# Patient Record
Sex: Female | Born: 1965 | Race: Black or African American | Hispanic: No | Marital: Single | Smoking: Never smoker
Health system: Southern US, Community
[De-identification: ages and names within clinical notes are randomized; demographics above are authoritative.]

## PROBLEM LIST (undated history)

## (undated) HISTORY — PX: CHOLECYSTECTOMY: SHX55

## (undated) HISTORY — PX: ABDOMINAL HYSTERECTOMY: SHX81

---

## 2004-04-28 ENCOUNTER — Emergency Department: Payer: Self-pay | Admitting: Emergency Medicine

## 2004-07-29 ENCOUNTER — Emergency Department: Payer: Self-pay | Admitting: Emergency Medicine

## 2004-08-28 ENCOUNTER — Emergency Department: Payer: Self-pay | Admitting: Unknown Physician Specialty

## 2004-11-27 ENCOUNTER — Ambulatory Visit: Payer: Self-pay | Admitting: Obstetrics and Gynecology

## 2005-05-06 ENCOUNTER — Emergency Department: Payer: Self-pay | Admitting: Emergency Medicine

## 2005-09-28 ENCOUNTER — Emergency Department: Payer: Self-pay | Admitting: Emergency Medicine

## 2005-10-01 ENCOUNTER — Emergency Department: Payer: Self-pay | Admitting: Emergency Medicine

## 2005-10-08 ENCOUNTER — Emergency Department: Payer: Self-pay | Admitting: Emergency Medicine

## 2006-02-17 ENCOUNTER — Emergency Department (HOSPITAL_COMMUNITY): Admission: EM | Admit: 2006-02-17 | Discharge: 2006-02-17 | Payer: Self-pay | Admitting: Emergency Medicine

## 2006-07-01 ENCOUNTER — Emergency Department (HOSPITAL_COMMUNITY): Admission: EM | Admit: 2006-07-01 | Discharge: 2006-07-01 | Payer: Self-pay | Admitting: Emergency Medicine

## 2006-08-08 ENCOUNTER — Emergency Department (HOSPITAL_COMMUNITY): Admission: EM | Admit: 2006-08-08 | Discharge: 2006-08-08 | Payer: Self-pay | Admitting: Emergency Medicine

## 2007-05-26 ENCOUNTER — Emergency Department: Payer: Self-pay | Admitting: Emergency Medicine

## 2008-04-12 ENCOUNTER — Emergency Department: Payer: Self-pay | Admitting: Emergency Medicine

## 2008-09-30 ENCOUNTER — Emergency Department: Payer: Self-pay | Admitting: Emergency Medicine

## 2009-05-06 ENCOUNTER — Emergency Department: Payer: Self-pay | Admitting: Emergency Medicine

## 2010-08-22 ENCOUNTER — Emergency Department: Payer: Self-pay | Admitting: Emergency Medicine

## 2010-12-17 LAB — URINALYSIS, ROUTINE W REFLEX MICROSCOPIC
Bilirubin Urine: NEGATIVE
Glucose, UA: NEGATIVE
Protein, ur: NEGATIVE
Urobilinogen, UA: 1

## 2010-12-17 LAB — URINE CULTURE: Colony Count: >=100000

## 2010-12-17 LAB — BASIC METABOLIC PANEL
CO2: 27
Calcium: 9.8
Chloride: 110
GFR calc Af Amer: >60
Sodium: 143

## 2010-12-17 LAB — URINE MICROSCOPIC-ADD ON

## 2010-12-31 ENCOUNTER — Emergency Department (HOSPITAL_COMMUNITY)
Admission: EM | Admit: 2010-12-31 | Discharge: 2010-12-31 | Disposition: A | Discharge status: Home / Self Care | Payer: Self-pay | Attending: Emergency Medicine | Admitting: Emergency Medicine

## 2010-12-31 DIAGNOSIS — J329 Chronic sinusitis, unspecified: Secondary | ICD-10-CM | POA: Insufficient documentation

## 2010-12-31 DIAGNOSIS — J069 Acute upper respiratory infection, unspecified: Secondary | ICD-10-CM | POA: Insufficient documentation

## 2010-12-31 DIAGNOSIS — R5381 Other malaise: Secondary | ICD-10-CM | POA: Insufficient documentation

## 2010-12-31 DIAGNOSIS — R071 Chest pain on breathing: Secondary | ICD-10-CM | POA: Insufficient documentation

## 2011-08-13 ENCOUNTER — Emergency Department: Payer: Self-pay | Admitting: Unknown Physician Specialty

## 2012-03-30 ENCOUNTER — Emergency Department: Payer: Self-pay | Admitting: Internal Medicine

## 2012-03-30 LAB — COMPREHENSIVE METABOLIC PANEL
Albumin: 3.7 g/dL (ref 3.4–5.0)
Chloride: 108 mmol/L — ABNORMAL HIGH (ref 98–107)
Co2: 25 mmol/L (ref 21–32)
Glucose: 107 mg/dL — ABNORMAL HIGH (ref 65–99)
Osmolality: 275 (ref 275–301)
Total Protein: 7 g/dL (ref 6.4–8.2)

## 2012-03-30 LAB — CBC
HCT: 35.8 % (ref 35.0–47.0)
MCHC: 32.1 g/dL (ref 32.0–36.0)
MCV: 75 fL — ABNORMAL LOW (ref 80–100)
Platelet: 164 10*3/uL (ref 150–440)
RDW: 14.5 % (ref 11.5–14.5)

## 2012-03-30 LAB — HCG, QUANTITATIVE, PREGNANCY: Beta Hcg, Quant.: <1 m[IU]/mL — ABNORMAL LOW

## 2012-03-30 LAB — URINALYSIS, COMPLETE
Glucose,UR: NEGATIVE mg/dL (ref 0–75)
Ph: 5 (ref 4.5–8.0)
RBC,UR: 15 /HPF (ref 0–5)
Specific Gravity: 1.023 (ref 1.003–1.030)
WBC UR: 38 /HPF (ref 0–5)

## 2012-04-18 ENCOUNTER — Ambulatory Visit: Payer: Self-pay | Admitting: Obstetrics and Gynecology

## 2012-04-18 LAB — URINALYSIS, COMPLETE
Ketone: NEGATIVE
Ph: 5 (ref 4.5–8.0)
Specific Gravity: 1.026 (ref 1.003–1.030)
WBC UR: 18 /HPF (ref 0–5)

## 2012-04-18 LAB — CBC
HCT: 35.8 % (ref 35.0–47.0)
MCV: 75 fL — ABNORMAL LOW (ref 80–100)
Platelet: 233 10*3/uL (ref 150–440)
RBC: 4.8 10*6/uL (ref 3.80–5.20)

## 2012-04-28 ENCOUNTER — Inpatient Hospital Stay: Payer: Self-pay | Admitting: Obstetrics and Gynecology

## 2012-04-29 LAB — HEMATOCRIT: HCT: 26.3 % — ABNORMAL LOW (ref 35.0–47.0)

## 2012-04-30 ENCOUNTER — Emergency Department: Payer: Self-pay | Admitting: Emergency Medicine

## 2012-05-01 LAB — PATHOLOGY REPORT

## 2012-05-09 ENCOUNTER — Emergency Department: Payer: Self-pay | Admitting: Emergency Medicine

## 2012-05-09 LAB — URINALYSIS, COMPLETE
Glucose,UR: NEGATIVE mg/dL (ref 0–75)
Nitrite: NEGATIVE
Ph: 5 (ref 4.5–8.0)
Protein: 100
RBC,UR: 38 /HPF (ref 0–5)
Specific Gravity: 1.023 (ref 1.003–1.030)

## 2012-05-09 LAB — COMPREHENSIVE METABOLIC PANEL
Albumin: 3.4 g/dL (ref 3.4–5.0)
Alkaline Phosphatase: 106 U/L (ref 50–136)
Creatinine: 0.57 mg/dL — ABNORMAL LOW (ref 0.60–1.30)
EGFR (Non-African Amer.): >60
Osmolality: 277 (ref 275–301)
Potassium: 3.9 mmol/L (ref 3.5–5.1)
SGOT(AST): 14 U/L — ABNORMAL LOW (ref 15–37)
SGPT (ALT): 35 U/L (ref 12–78)

## 2012-05-09 LAB — CBC
HGB: 8.8 g/dL — ABNORMAL LOW (ref 12.0–16.0)
MCHC: 31.7 g/dL — ABNORMAL LOW (ref 32.0–36.0)
RDW: 14.9 % — ABNORMAL HIGH (ref 11.5–14.5)
WBC: 9.8 10*3/uL (ref 3.6–11.0)

## 2012-05-17 ENCOUNTER — Ambulatory Visit: Payer: Self-pay | Admitting: Obstetrics and Gynecology

## 2013-02-17 ENCOUNTER — Emergency Department: Payer: Self-pay | Admitting: Emergency Medicine

## 2013-05-10 ENCOUNTER — Emergency Department: Payer: Self-pay | Admitting: Emergency Medicine

## 2013-05-13 ENCOUNTER — Emergency Department: Payer: Self-pay | Admitting: Emergency Medicine

## 2013-05-13 LAB — CBC WITH DIFFERENTIAL/PLATELET
BASOS ABS: 0 10*3/uL (ref 0.0–0.1)
BASOS PCT: 0.6 %
EOS ABS: 0.3 10*3/uL (ref 0.0–0.7)
EOS PCT: 3.7 %
HCT: 36.9 % (ref 35.0–47.0)
HGB: 11.5 g/dL — AB (ref 12.0–16.0)
LYMPHS ABS: 1.6 10*3/uL (ref 1.0–3.6)
LYMPHS PCT: 23.1 %
MCH: 23.6 pg — ABNORMAL LOW (ref 26.0–34.0)
MCHC: 31.2 g/dL — ABNORMAL LOW (ref 32.0–36.0)
MCV: 76 fL — ABNORMAL LOW (ref 80–100)
MONO ABS: 0.4 x10 3/mm (ref 0.2–0.9)
Monocyte %: 5.6 %
NEUTROS ABS: 4.7 10*3/uL (ref 1.4–6.5)
Neutrophil %: 67 %
Platelet: 191 10*3/uL (ref 150–440)
RBC: 4.87 10*6/uL (ref 3.80–5.20)
RDW: 14.9 % — AB (ref 11.5–14.5)
WBC: 7 10*3/uL (ref 3.6–11.0)

## 2013-05-13 LAB — COMPREHENSIVE METABOLIC PANEL
ALBUMIN: 3.7 g/dL (ref 3.4–5.0)
ALK PHOS: 79 U/L
AST: 26 U/L (ref 15–37)
Anion Gap: 5 — ABNORMAL LOW (ref 7–16)
BILIRUBIN TOTAL: 0.3 mg/dL (ref 0.2–1.0)
BUN: 11 mg/dL (ref 7–18)
CALCIUM: 9.1 mg/dL (ref 8.5–10.1)
CHLORIDE: 107 mmol/L (ref 98–107)
CREATININE: 0.87 mg/dL (ref 0.60–1.30)
Co2: 28 mmol/L (ref 21–32)
EGFR (African American): >60
GLUCOSE: 102 mg/dL — AB (ref 65–99)
OSMOLALITY: 279 (ref 275–301)
POTASSIUM: 4.1 mmol/L (ref 3.5–5.1)
SGPT (ALT): 29 U/L (ref 12–78)
Sodium: 140 mmol/L (ref 136–145)
TOTAL PROTEIN: 6.9 g/dL (ref 6.4–8.2)

## 2013-09-23 ENCOUNTER — Emergency Department: Payer: Self-pay | Admitting: Emergency Medicine

## 2014-01-16 ENCOUNTER — Emergency Department: Payer: Self-pay | Admitting: Student

## 2014-01-16 LAB — CBC
HCT: 38.2 % (ref 35.0–47.0)
HGB: 11.9 g/dL — ABNORMAL LOW (ref 12.0–16.0)
MCH: 23.9 pg — AB (ref 26.0–34.0)
MCHC: 31.2 g/dL — AB (ref 32.0–36.0)
MCV: 76 fL — AB (ref 80–100)
PLATELETS: 216 10*3/uL (ref 150–440)
RBC: 5 10*6/uL (ref 3.80–5.20)
RDW: 14.5 % (ref 11.5–14.5)
WBC: 7.1 10*3/uL (ref 3.6–11.0)

## 2014-01-16 LAB — COMPREHENSIVE METABOLIC PANEL
ANION GAP: 6 — AB (ref 7–16)
AST: 18 U/L (ref 15–37)
Albumin: 4 g/dL (ref 3.4–5.0)
Alkaline Phosphatase: 69 U/L
BUN: 9 mg/dL (ref 7–18)
Bilirubin,Total: 0.6 mg/dL (ref 0.2–1.0)
CREATININE: 0.84 mg/dL (ref 0.60–1.30)
Calcium, Total: 9.2 mg/dL (ref 8.5–10.1)
Chloride: 107 mmol/L (ref 98–107)
Co2: 27 mmol/L (ref 21–32)
EGFR (Non-African Amer.): >60
Glucose: 92 mg/dL (ref 65–99)
Osmolality: 278 (ref 275–301)
POTASSIUM: 3.7 mmol/L (ref 3.5–5.1)
SGPT (ALT): 26 U/L
Sodium: 140 mmol/L (ref 136–145)
TOTAL PROTEIN: 6.9 g/dL (ref 6.4–8.2)

## 2014-01-16 LAB — TROPONIN I: Troponin-I: <0.02 ng/mL

## 2014-06-21 NOTE — Op Note (Signed)
PATIENT NAME:  Denise Bowers, Denise Bowers MR#:  [id number] DATE OF BIRTH:  01-Sep-1965  DATE OF PROCEDURE:  04/28/2012  PREOPERATIVE DIAGNOSIS: Menorrhagia and fibroids.   POSTOPERATIVE DIAGNOSIS: Menorrhagia and fibroids.   PROCEDURE: Total abdominal hysterectomy, cystoscopy, repair of cystotomy.   SURGEON: Senaida LangeLashawn Weaver Lee, M.D.   ASSISTANT: Vena AustriaAndreas Staebler, MD  ANESTHESIA:  General.   ESTIMATED BLOOD LOSS: 800 mL.  OPERATIVE FLUIDS: 2300 mL.   COMPLICATIONS: Cystotomy at bladder dome repaired with 3-0 Monocryl in a pursestring fashion.   FINDINGS: As above.   SPECIMEN: Uterus with cervix and fibroids.   INDICATIONS: The patient is a 49 year old who presents as an Emergency Room followup for heavy menstrual bleeding and findings of uterine fibroids on ultrasound. The patient desired definitive surgical management. Risks, benefits, indications and alternatives of the procedure were explained and informed consent was obtained.   DESCRIPTION OF PROCEDURE: The patient was taken to the operating room with IV fluids running. She was prepped and draped in the usual sterile fashion, in the supine position. A midline incision was made from the inferior umbilicus to the pubic symphysis. The incision was carried down to the fascia using the Bovie cautery. The peritoneum was identified. The muscle was retracted to the patient's left side. Two Kelly's were used to grasp the peritoneum and the peritoneum was entered bluntly with the Metzenbaum scissors. The opening was then extended with the Metzenbaums. The Balfour retractor with extender was placed inside of the abdomen. The severely enlarged uterus with multiple fibroids was encountered. The bowels were packed back with a moist laparotomy sponge. The right ovary was doubly clamped, cut and suture ligated x 2. The tube was likewise clamped, cut and suture ligated. At this time, since the exposure in the pelvis was minimal, based on the enlarged fibroids, a  myomectomy was performed removing the larger fibroids so that visualization could be improved. The areas of removal of the fibroids were then oversewn using figure-of-eight 0 Vicryl sutures. Visualization was improved at this point and the left ovary was doubly clamped and suture ligated as was the left tube. The bladder flap was created sharply using the Metzenbaum scissors and the bladder was further removed from the surgical site using moist sponge sticks. The posterior leaf of the broad ligament, on the patient's right side, was entered with the Bovie cautery and was clamped, cut and suture ligated. The uterine artery was skeletonized. It was clamped using curved Heaney clamps, cut and Heaney-tied with 0 Vicryl sutures. A straight Heaney clamp was then used to free up the remainder of the uterus and cervix. This was also repeated on the patient's left side. As the cervix was palpated, a curved Heaney clamp was placed under the cervix, on both sides, and the uterus was removed from the abdomen. As further bladder blunt dissection was performed, a bladder cystotomy at the dome was identified. The bladder was backfilled using normal saline containing indigo carmine dye and this dye was seen to spill from the cystotomy. At this point, the cystotomy was repaired using a 3-0 Vicryl on a SH needle, in a pursestring fashion. Further dye was injected into the Foley catheter and was still seen to spill from the cystotomy. At this point, the area was oversewn with a second layer and further backfilling of the bladder revealed no further spillage into the abdomen. At this point, the vaginal cuff was repaired with 0 Vicryl, in a running locked fashion. Arista was placed for further hemostasis. The pelvis was irrigated  with copious amounts of normal saline. All instrumentation and sponges were removed from the patient's abdomen. The midline incision was closed in a Smead-Jones fashion using 1 PDS, double-stranded, and the  skin was closed with staples. Cystoscopy was then performed at this time. The patient had been given indigo carmine dye by the anesthesiologist and dye was seen to spill from both ureteral orifices indicating intact ureters. The patient tolerated the procedure well. Sponge, lap, needle and instrument counts were correct x 2 and the patient was awakened from anesthesia and taken to the recovery room in stable condition.  ____________________________ Sonda Primes Patton Salles, MD law:sb D: 04/28/2012 11:16:42 ET     T: 04/28/2012 11:40:20 ET        JOB#: [id number] cc: Flint Melter A. Patton Salles, MD, <Dictator> Sonda Primes WEAVER LEE MD ELECTRONICALLY SIGNED 04/29/2012 1:42

## 2014-08-09 ENCOUNTER — Emergency Department
Admission: EM | Admit: 2014-08-09 | Discharge: 2014-08-09 | Disposition: A | Discharge status: Home / Self Care | Payer: Self-pay | Attending: Emergency Medicine | Admitting: Emergency Medicine

## 2014-08-09 ENCOUNTER — Encounter: Payer: Self-pay | Admitting: Emergency Medicine

## 2014-08-09 DIAGNOSIS — R21 Rash and other nonspecific skin eruption: Secondary | ICD-10-CM | POA: Insufficient documentation

## 2014-08-09 MED ORDER — PREDNISONE 10 MG PO TABS: ORAL_TABLET | ORAL | Status: AC

## 2014-08-09 NOTE — ED Notes (Signed)
Pt to ed with c/o rash to body x 2 months.  Pt states +itching. 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

## 2014-08-09 NOTE — Discharge Instructions (Signed)
° ° °  BEGIN PREDNISONE TAPER AND BENADRYL 25 MG EVERY 6 HOURS AS NEEDED FOR ITCHING FOLLOW UP WITH [facility] OR DERMATOLOGIST OF YOUR CHOICE IF ANY CONTINUED PROBLEMS

## 2014-08-09 NOTE — ED Notes (Signed)
Pt states that she has had rash all over her body for the last 2 months that itches and hurts . She has been using rubbing alcohol and hydrocortisone -10 which did not seem to help.   She started working at [employer] in march this year , the rashes appeared on the body. 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 She appears to be in no distress at this time.

## 2014-08-09 NOTE — ED Provider Notes (Signed)
[facility] Emergency Department Provider Note  ____________________________________________  Time seen: 1119  I have reviewed the triage vital signs and the nursing notes.   HISTORY  Chief Complaint Rash   HPI Denise Bowers is a 49 y.o. female comes in today with complaint of rash on her body for over 2 months. She states it itches a great deal. No else in her family has this rash. She denies any cats or dogs in her house. She has tried over-the-counter cortisone cream and rubbing alcohol without any relief. She believes that this is coming from working in a [employer] that she started in March. 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 She denies any fever. Nothing has made it better, everything she's tried has not made a difference. He denies any pain.   History reviewed. No pertinent past medical history.  There are no active problems to display for this patient.   Past Surgical History  Procedure Laterality Date  . Abdominal hysterectomy    . Cholecystectomy      Current Outpatient Rx  Name  Route  Sig  Dispense  Refill  . predniSONE (DELTASONE) 10 MG tablet      Take 6 tablets  today, on day 2 take 5 tablets, day 3 take 4 tablets, day 4 take 3 tablets, day 5 take  2 tablets and 1 tablet the last day   21 tablet   0     Allergies Review of patient's allergies indicates no known allergies.  History reviewed. No pertinent family history.  Social History History  Substance Use Topics  . Smoking status: Never Smoker   . Smokeless tobacco: Not on file  . Alcohol Use: Yes    Review of Systems Constitutional: No fever/chills Eyes: No visual changes. ENT: No sore throat. Cardiovascular: Denies chest pain. Respiratory: Denies shortness of breath. Gastrointestinal: No abdominal pain.  No nausea, no vomiting.   Genitourinary: Negative for  dysuria. Musculoskeletal: Negative for back pain. Skin: Positive for rash. Neurological: Negative for headaches, focal weakness or numbness.  10-point ROS otherwise negative.  ____________________________________________   PHYSICAL EXAM:  VITAL SIGNS: ED Triage Vitals  Enc Vitals Group     BP 08/09/14 1104 140/77 mmHg     Pulse Rate 08/09/14 1101 72     Resp 08/09/14 1101 20     Temp 08/09/14 1101 97.2 F (36.2 C)     Temp Source 08/09/14 1101 Oral     SpO2 08/09/14 1101 100 %     Weight 08/09/14 1101 125 lb (56.7 kg)     Height 08/09/14 1101 5\' 1"  (1.549 m)     Head Cir --      Peak Flow --      Pain Score 08/09/14 1102 0     Pain Loc --      Pain Edu? --      Excl. in GC? --     Constitutional: Alert and oriented. Well appearing and in no acute distress. Eyes: Conjunctivae are normal. PERRL. EOMI. Head: Atraumatic. Nose: No congestion/rhinnorhea. Mouth/Throat: Mucous membranes are moist.  Oropharynx non-erythematous. Neck: No stridor.  Supple Hematological/Lymphatic/Immunilogical: No cervical lymphadenopathy. Cardiovascular: Normal rate, regular rhythm. Grossly normal heart sounds.  Good peripheral circulation. Respiratory: Normal respiratory effort.  No retractions. Lungs CTAB. Gastrointestinal: Soft and nontender. No distention. Musculoskeletal: No lower extremity tenderness nor edema.  No joint effusions. Neurologic:  Normal speech and  language. No gross focal neurologic deficits are appreciated. Speech is normal. No gait instability. Skin:  Skin is warm, dry and intact. There are diffuse scattered papules over her torso, upper and lower extremities. These are nontender without drainage. Right-sided abdomen appears to be some type of bite. The newest of these on the abdomen appear to have 1 black speck in each of the papules.  Psychiatric: Mood and affect are normal. Speech and behavior are normal.  ____________________________________________   LABS (all labs  ordered are listed, but only abnormal results are displayed)  Labs Reviewed - No data to display _ PROCEDURES  Procedure(s) performed: None  Critical Care performed: No  ____________________________________________   INITIAL IMPRESSION / ASSESSMENT AND PLAN / ED COURSE  Pertinent labs & imaging results that were available during my care of the patient were reviewed by me and considered in my medical decision making (see chart for details).  Patient was started on prednisone. She is also to take Benadryl if not working for the itching. She'll follow up [facility] with [facility] or her dermatologist listed on her papers. ____________________________________________   FINAL CLINICAL IMPRESSION(S) / ED DIAGNOSES  Final diagnoses:  Rash/skin eruption      Tommi Rumps, PA-C 08/09/14 1147  Emily Filbert, MD 08/09/14 1259

## 2015-01-23 IMAGING — US US PELV - US TRANSVAGINAL
1 series · 14 of 25 positions shown · non-contrast
Comparison: none

REASON FOR EXAM: pelvic pain
COMMENTS:

[Series 1: us pelv - us transvaginal · 0.28mm/px · 14 of 68 slices shown]
[im 1/68]
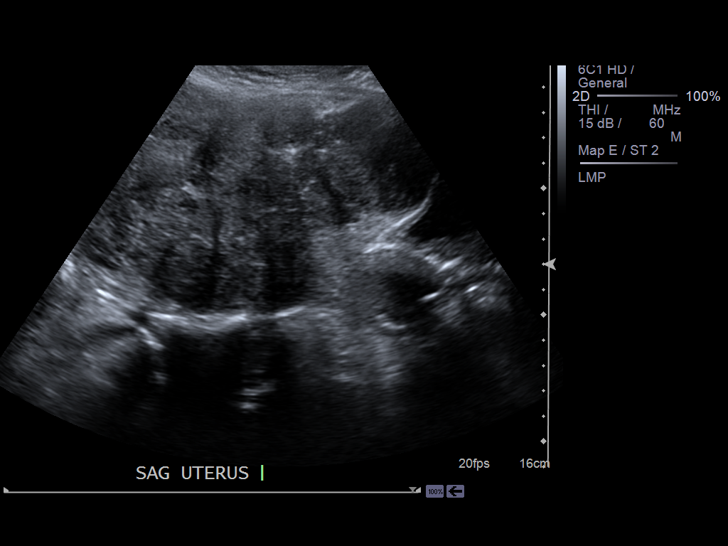
[im 6/68]
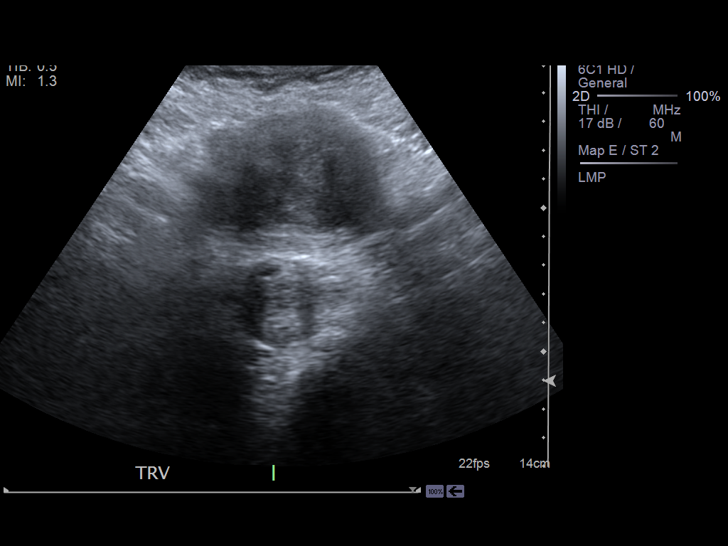
[im 12/68]
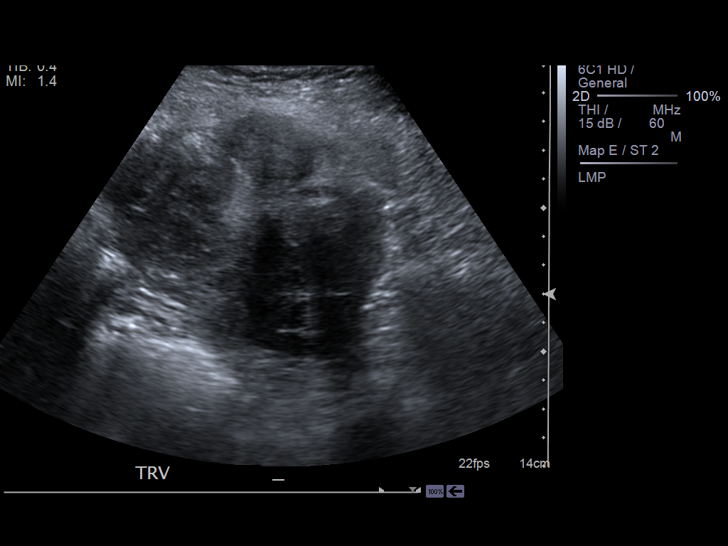
[im 17/68]
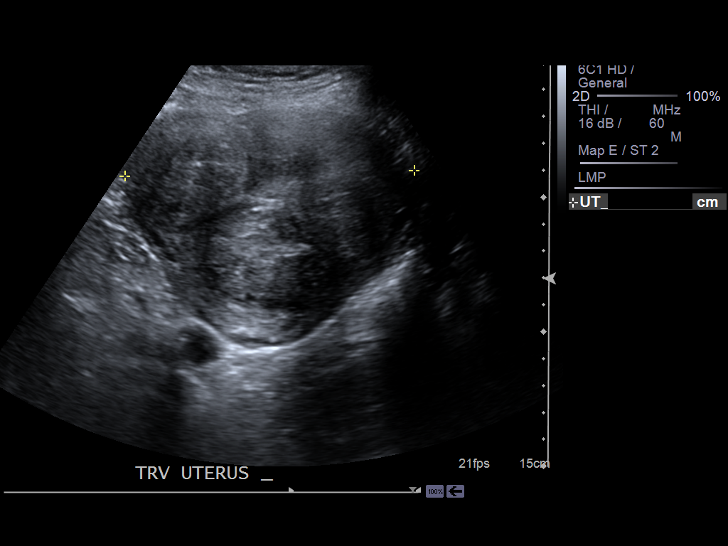
[im 23/68]
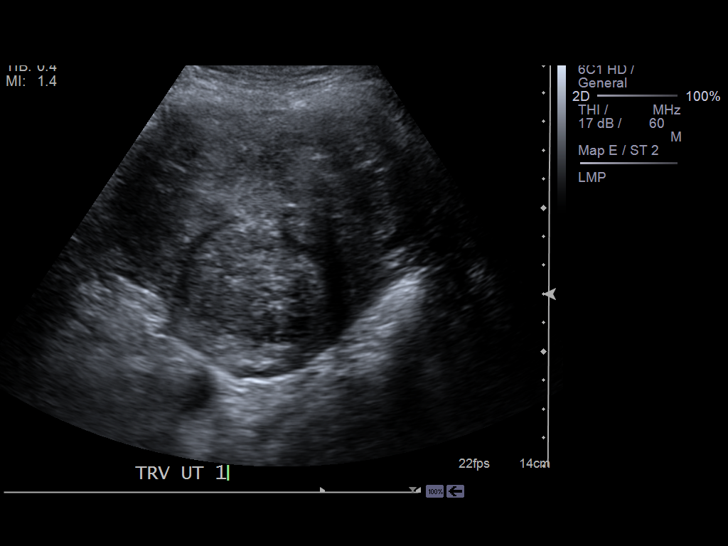
[im 26/68]
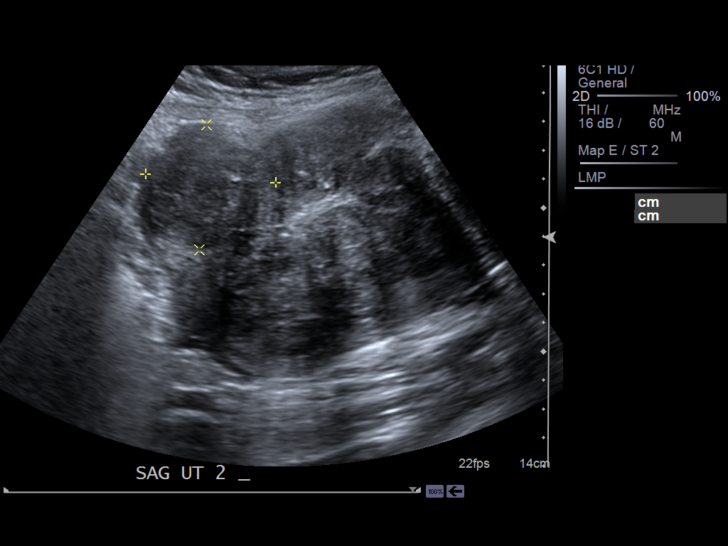
[im 31/68]
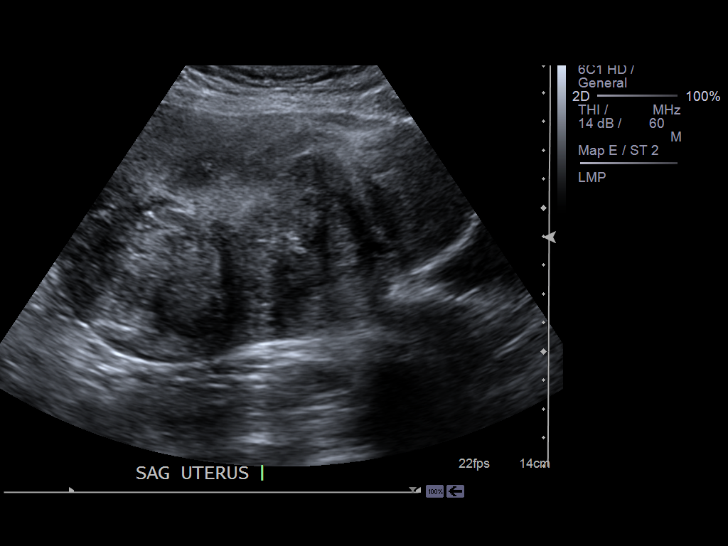
[im 37/68]
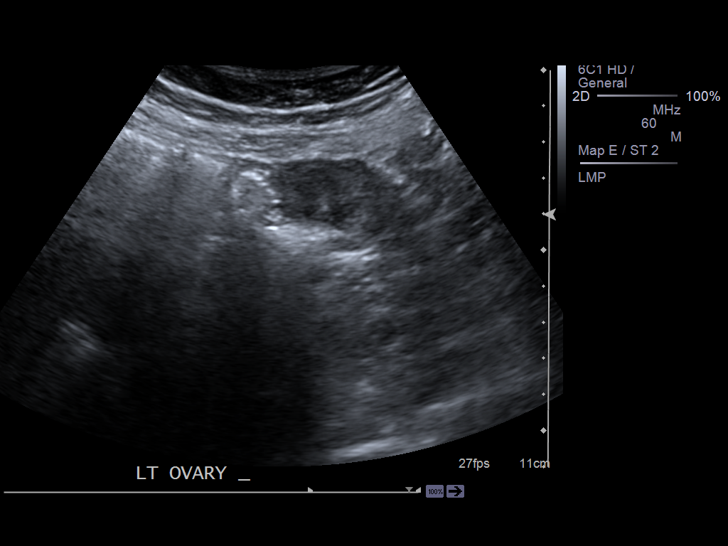
[im 42/68]
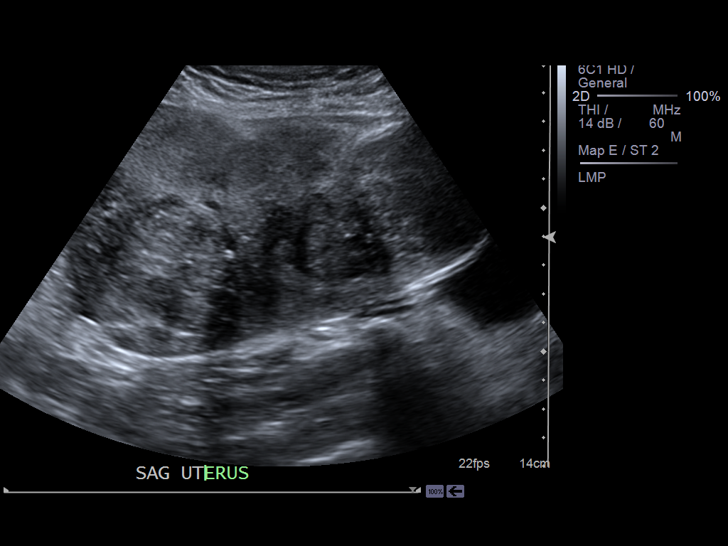
[im 45/68]
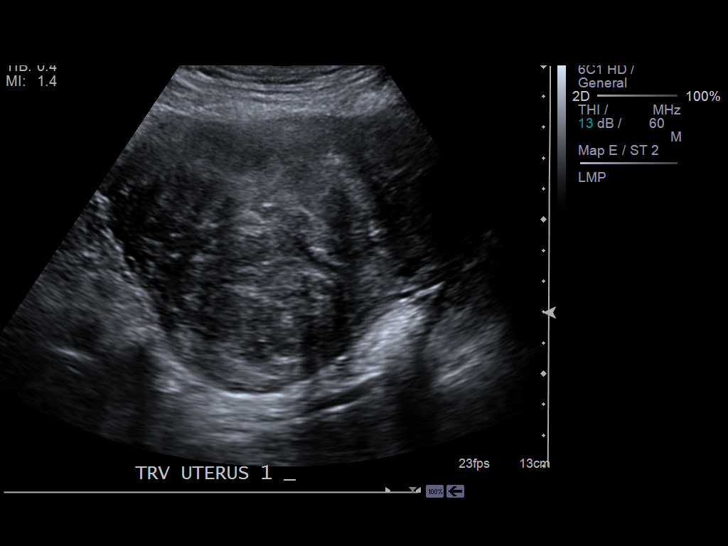
[im 51/68]
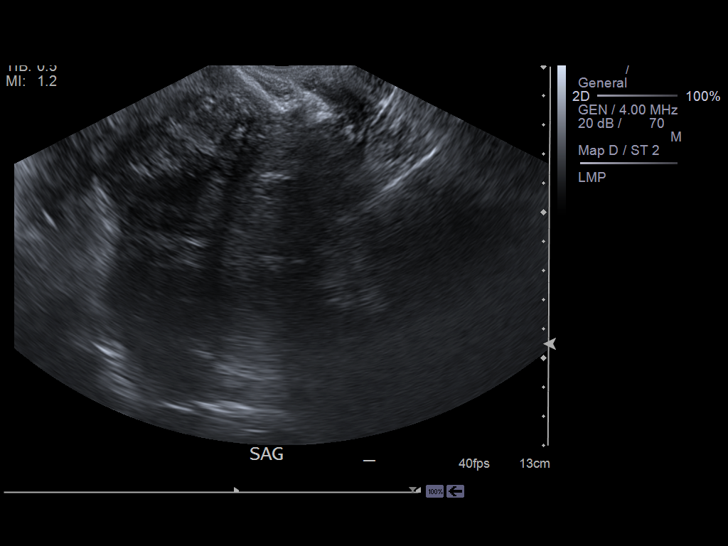
[im 56/68]
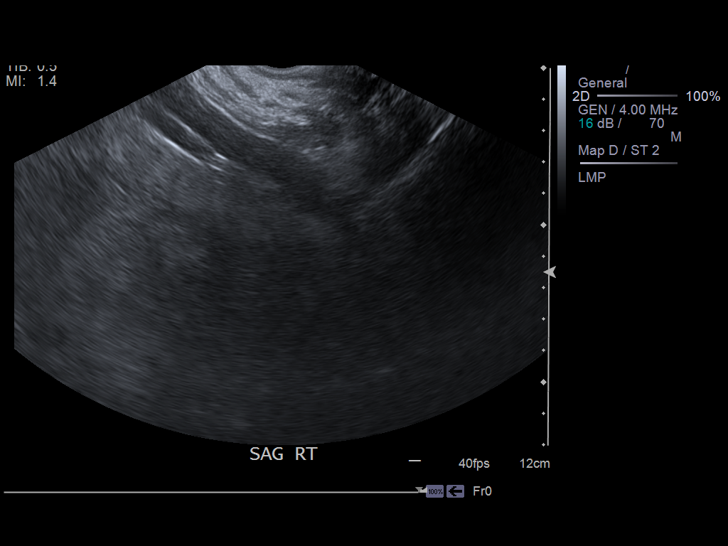
[im 62/68]
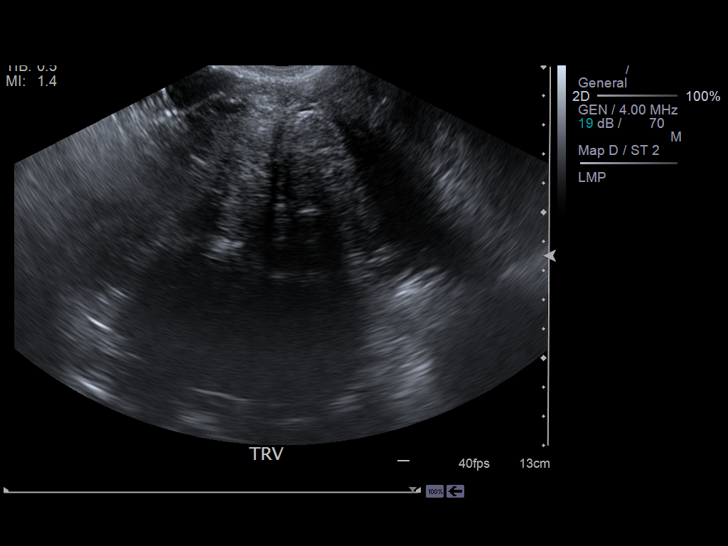
[im 68/68]
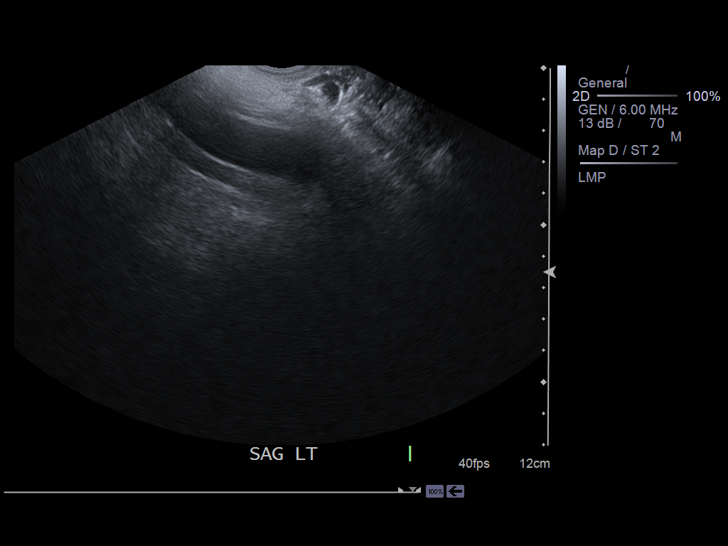

[14 of 25 positions shown; findings below may reference images not displayed]

PROCEDURE:     US  - US PELVIS EXAM W/TRANSVAGINAL  - March 30, 2012  [DATE]

RESULT:     Transabdominal and endovaginal images show a fibroid uterus
which is enlarged and measures 15.19 x 10.75 x 8.83 cm. The largest fibroid
measures 5.4 x 5.1 x 4.2 cm. The right ovary is not seen. The left ovary
measures 4.19 x 2.49 x 3.13 cm. The endometrium could not be measured.
IMPRESSION: Enlarged, fibroid uterus. Nonvisualization of the right
ovary. The left ovary is normal in size and shows evidence of blood flow.
There is no hydronephrosis demonstrated.

[REDACTED]

## 2015-03-12 IMAGING — CT CT PELVIS WO/W CM
3 of 5 series · 14 of 32 positions shown, 19 images · non-contrast
Comparison: none

REASON FOR EXAM: Bladder injury punctured during surgery
COMMENTS:

[Series 2: wo · axial · 0.64mm/px · z∈[-334,-178]mm · 5 of 78 slices shown, 10 images]
[im 13/78  soft-tissue]
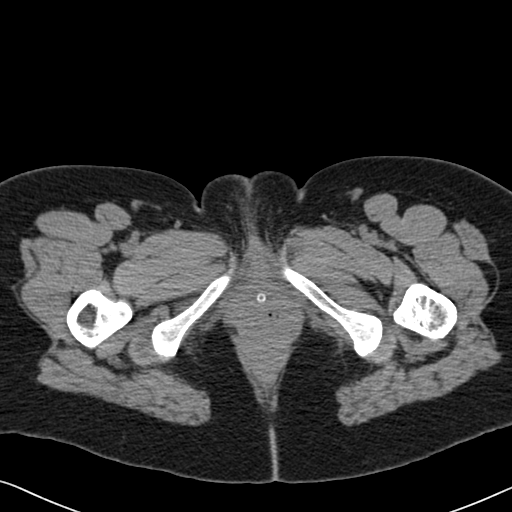
[im 13/78  bone]
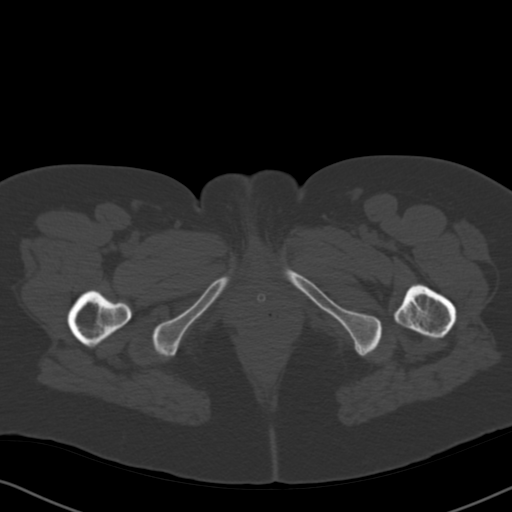
[im 26/78  soft-tissue]
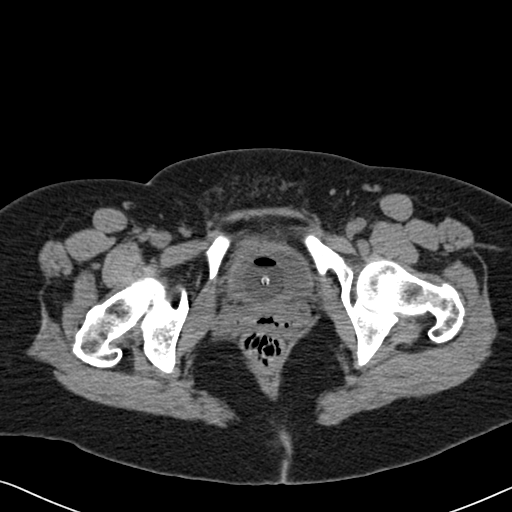
[im 26/78  lung]
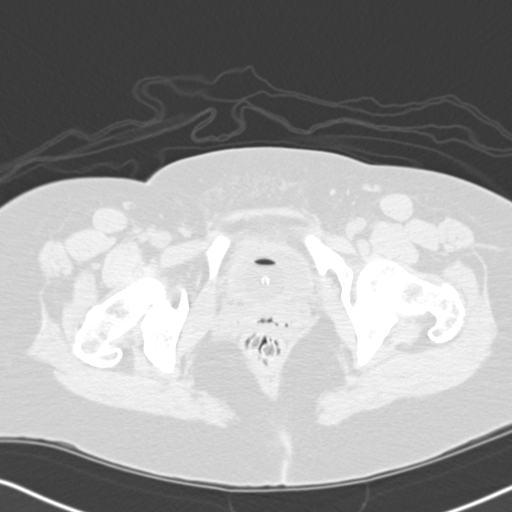
[im 39/78  soft-tissue]
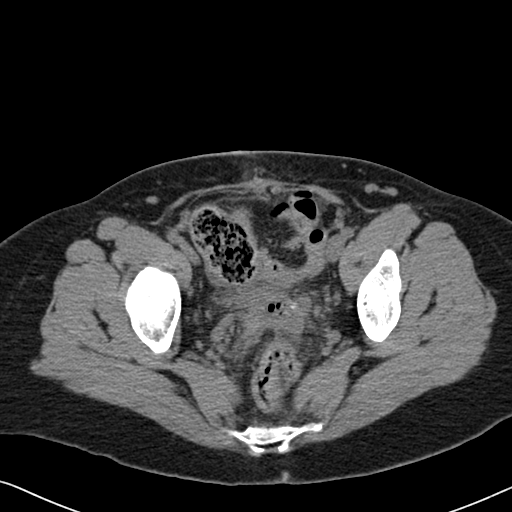
[im 39/78  lung]
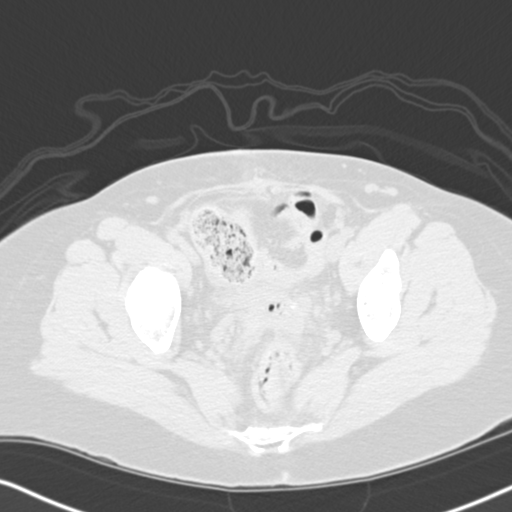
[im 52/78  soft-tissue]
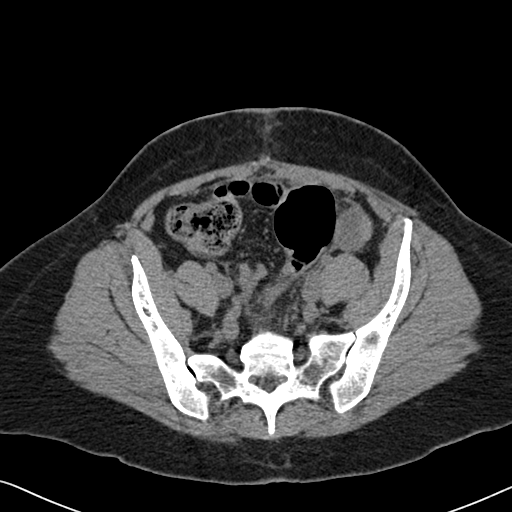
[im 52/78  lung]
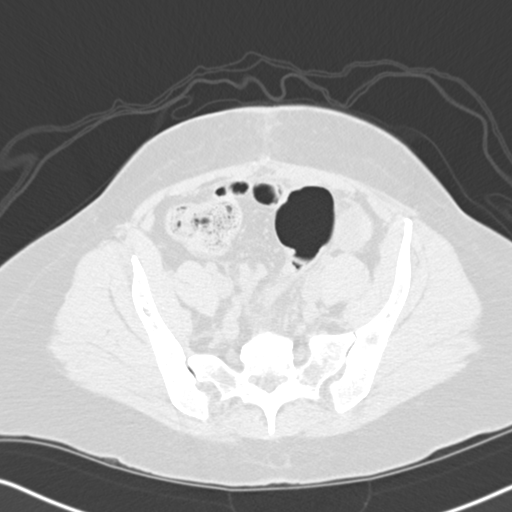
[im 65/78  soft-tissue]
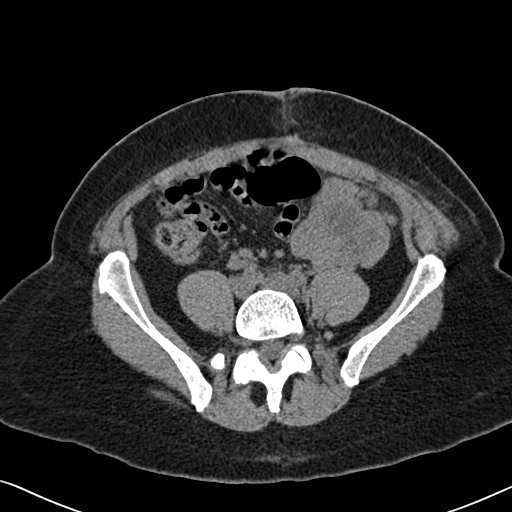
[im 65/78  lung]
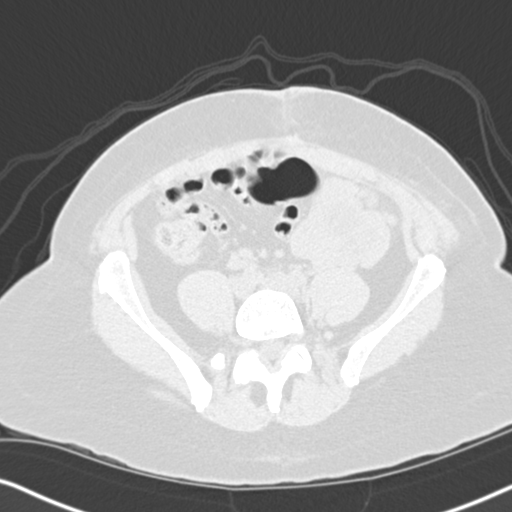

[Series 4: with · axial · 0.65mm/px · z∈[-328,-178]mm · 5 of 76 slices shown]
[im 13/76  soft-tissue]
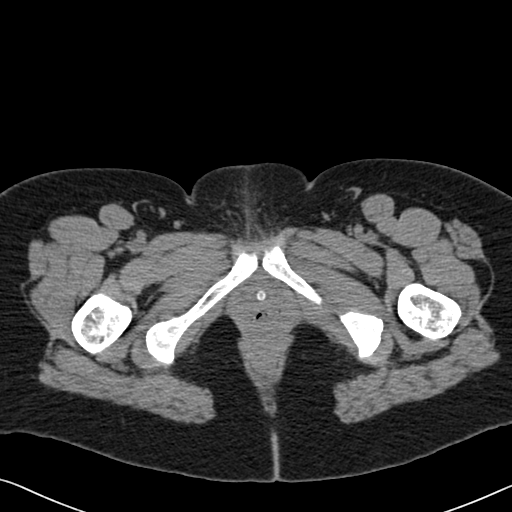
[im 26/76  soft-tissue]
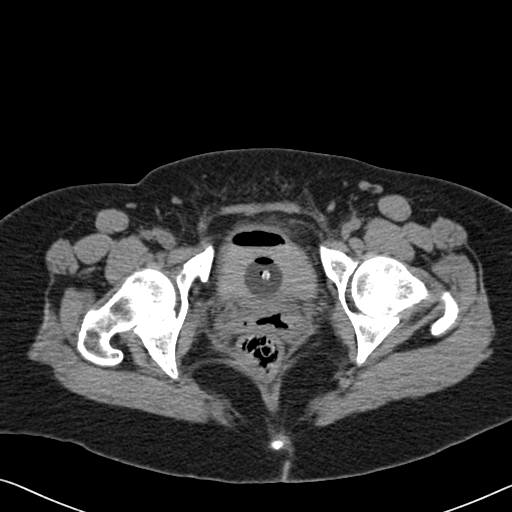
[im 38/76  soft-tissue]
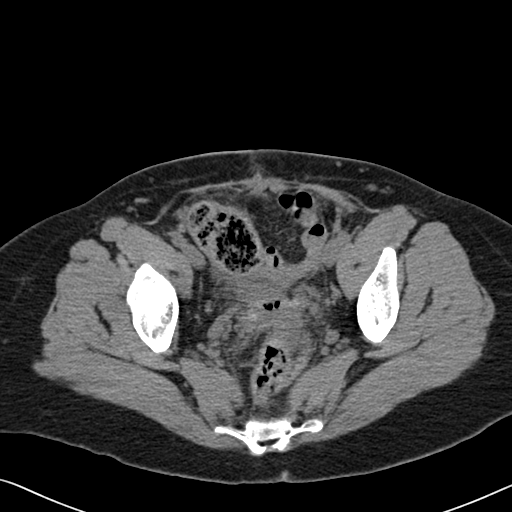
[im 51/76  soft-tissue]
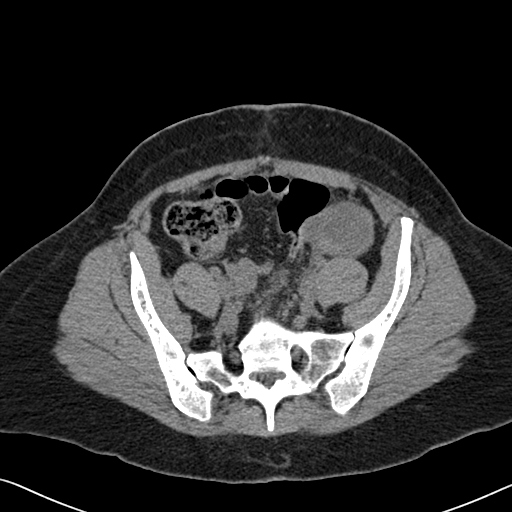
[im 63/76  soft-tissue]
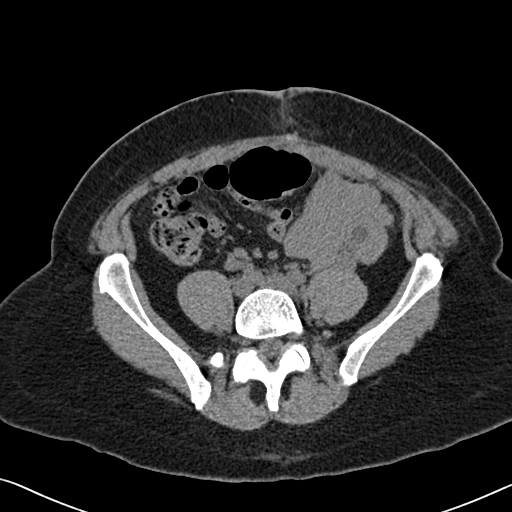

[Series 13: void · axial · 0.65mm/px · z∈[-332,-218]mm · 4 of 77 slices shown]
[im 13/77  soft-tissue]
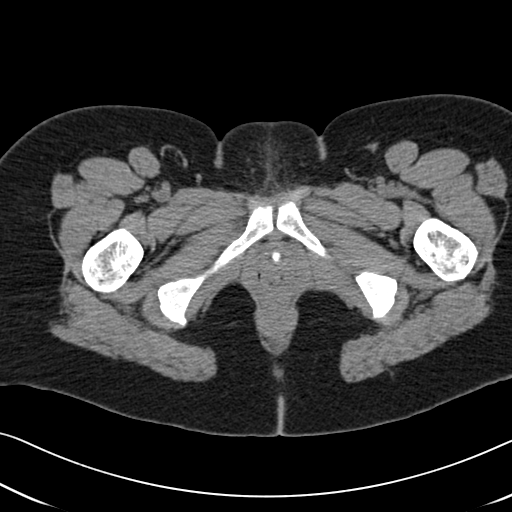
[im 26/77  soft-tissue]
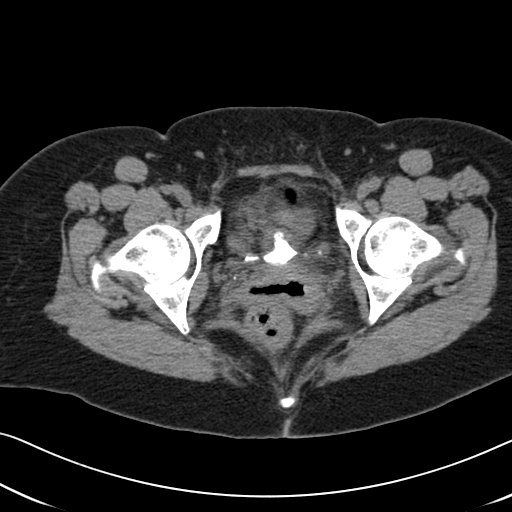
[im 39/77  soft-tissue]
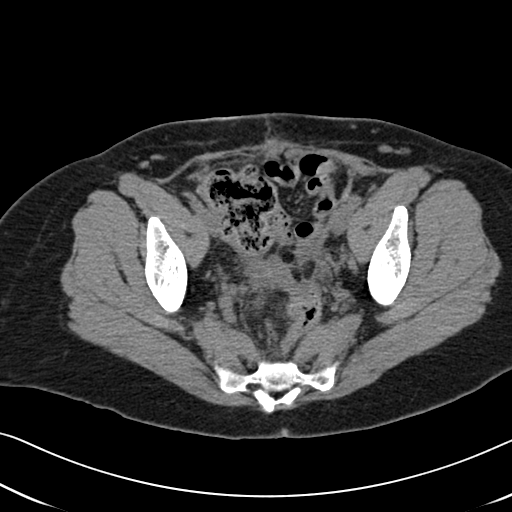
[im 51/77  soft-tissue]
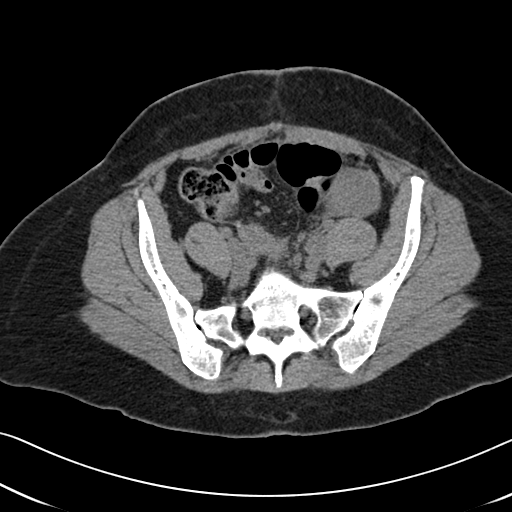

[14 of 32 positions shown; findings below may reference images not displayed]

PROCEDURE:     CT  - CT PELVIS W/WO CYSTOGRAM  - May 17, 2012 [DATE]

RESULT:     A multiphasic CT scan was performed through the pelvis to assess
the integrity of the recent urinary bladder repair. Reference is made to a
previous study dated May 09, 2012. The patient underwent instillation of
contrast into the urinary bladder via the Foley catheter.

The noncontrast images reveal a small amount of urine in the urinary
bladder. The Foley catheter balloon is in normal position. No free fluid is
demonstrated within the pelvis. Following contrast instillation there is no
evidence of leakage of the urine from the urinary bladder. Sequential
sequences were acquired over approximately 45 minutes. On the drainage film
only a small amount of contrasted urine remains.

The vaginal cuff demonstrates thickening of its wall that is somewhat less
conspicuous than on the previous study.
IMPRESSION: There is no evidence of a residual leak of the urinary
bladder.

[REDACTED]
